# Patient Record
Sex: Female | Born: 2000 | Race: Black or African American | Hispanic: No | Marital: Single
Health system: Southern US, Community
[De-identification: ages and names within clinical notes are randomized; demographics above are authoritative.]

---
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
## 2019-11-11 DIAGNOSIS — S82431A Displaced oblique fracture of shaft of right fibula, initial encounter for closed fracture: Secondary | ICD-10-CM | POA: Diagnosis not present

## 2019-11-11 DIAGNOSIS — S82831A Other fracture of upper and lower end of right fibula, initial encounter for closed fracture: Secondary | ICD-10-CM | POA: Diagnosis not present

## 2019-11-11 DIAGNOSIS — M25571 Pain in right ankle and joints of right foot: Secondary | ICD-10-CM | POA: Diagnosis not present

## 2019-11-13 DIAGNOSIS — S82891A Other fracture of right lower leg, initial encounter for closed fracture: Secondary | ICD-10-CM | POA: Diagnosis not present

## 2019-11-18 DIAGNOSIS — E669 Obesity, unspecified: Secondary | ICD-10-CM | POA: Diagnosis not present

## 2019-11-18 DIAGNOSIS — G8918 Other acute postprocedural pain: Secondary | ICD-10-CM | POA: Diagnosis not present

## 2019-11-18 DIAGNOSIS — M659 Synovitis and tenosynovitis, unspecified: Secondary | ICD-10-CM | POA: Diagnosis not present

## 2019-11-18 DIAGNOSIS — S82891A Other fracture of right lower leg, initial encounter for closed fracture: Secondary | ICD-10-CM | POA: Diagnosis not present

## 2019-11-18 DIAGNOSIS — S82841A Displaced bimalleolar fracture of right lower leg, initial encounter for closed fracture: Secondary | ICD-10-CM | POA: Diagnosis not present

## 2019-11-18 DIAGNOSIS — Y92488 Other paved roadways as the place of occurrence of the external cause: Secondary | ICD-10-CM | POA: Diagnosis not present

## 2019-11-18 DIAGNOSIS — S9001XA Contusion of right ankle, initial encounter: Secondary | ICD-10-CM | POA: Diagnosis not present

## 2019-11-18 DIAGNOSIS — M24071 Loose body in right ankle: Secondary | ICD-10-CM | POA: Diagnosis not present

## 2019-11-18 DIAGNOSIS — S8261XA Displaced fracture of lateral malleolus of right fibula, initial encounter for closed fracture: Secondary | ICD-10-CM | POA: Diagnosis not present

## 2019-11-18 DIAGNOSIS — Y93I9 Activity, other involving external motion: Secondary | ICD-10-CM | POA: Diagnosis not present

## 2019-11-18 DIAGNOSIS — Z6832 Body mass index (BMI) 32.0-32.9, adult: Secondary | ICD-10-CM | POA: Diagnosis not present

## 2019-11-18 DIAGNOSIS — Z4789 Encounter for other orthopedic aftercare: Secondary | ICD-10-CM | POA: Diagnosis not present

## 2019-11-18 DIAGNOSIS — S93431A Sprain of tibiofibular ligament of right ankle, initial encounter: Secondary | ICD-10-CM | POA: Diagnosis not present

## 2019-11-18 DIAGNOSIS — S93421A Sprain of deltoid ligament of right ankle, initial encounter: Secondary | ICD-10-CM | POA: Diagnosis not present

## 2019-12-03 DIAGNOSIS — S82891D Other fracture of right lower leg, subsequent encounter for closed fracture with routine healing: Secondary | ICD-10-CM | POA: Diagnosis not present
